# Patient Record
Sex: Male | Born: 1980 | Hispanic: No | Marital: Single | Smoking: Former smoker
Health system: Southern US, Community
[De-identification: ages and names within clinical notes are randomized; demographics above are authoritative.]

---

## 2006-05-24 ENCOUNTER — Encounter: Admission: RE | Admit: 2006-05-24 | Discharge: 2006-05-24 | Payer: Self-pay | Admitting: Specialist

## 2018-02-02 ENCOUNTER — Encounter: Payer: Self-pay | Admitting: Sports Medicine

## 2018-02-02 ENCOUNTER — Ambulatory Visit (INDEPENDENT_AMBULATORY_CARE_PROVIDER_SITE_OTHER): Payer: BLUE CROSS/BLUE SHIELD

## 2018-02-02 ENCOUNTER — Ambulatory Visit: Payer: BLUE CROSS/BLUE SHIELD | Admitting: Sports Medicine

## 2018-02-02 DIAGNOSIS — M25561 Pain in right knee: Secondary | ICD-10-CM | POA: Diagnosis not present

## 2018-02-02 DIAGNOSIS — M25569 Pain in unspecified knee: Secondary | ICD-10-CM

## 2018-02-02 DIAGNOSIS — M545 Low back pain, unspecified: Secondary | ICD-10-CM | POA: Insufficient documentation

## 2018-02-02 DIAGNOSIS — S83412A Sprain of medial collateral ligament of left knee, initial encounter: Secondary | ICD-10-CM

## 2018-02-02 DIAGNOSIS — M25562 Pain in left knee: Secondary | ICD-10-CM

## 2018-02-02 DIAGNOSIS — S8992XA Unspecified injury of left lower leg, initial encounter: Secondary | ICD-10-CM | POA: Diagnosis not present

## 2018-02-02 DIAGNOSIS — M48061 Spinal stenosis, lumbar region without neurogenic claudication: Secondary | ICD-10-CM | POA: Diagnosis not present

## 2018-02-02 MED ORDER — PREDNISONE 50 MG PO TABS: ORAL_TABLET | ORAL | 0 refills | Status: AC

## 2018-02-02 NOTE — Progress Notes (Signed)
Subjective:    CC: Back pain, left knee injury  HPI:  One month ago while playing soccer this pleasant 37 year old male went to kick the ball, he collided with another player, had immediate pain and swelling on the medial aspect of his left knee.  He was unable to continue playing.  His overall gotten to some degree worse.  Pain is medial no bruising, no swelling.  No mechanical symptoms.  Back pain: Left-sided, axial with radiation to the anterior left thigh, no bowel or bladder dysfunction, saddle numbness, no constitutional symptoms, no precipitating or palliating symptoms.  I reviewed the past medical history, family history, social history, surgical history, and allergies today and no changes were needed.  Please see the problem list section below in epic for further details.  Past Medical History: History reviewed. No pertinent past medical history. Past Surgical History: History reviewed. No pertinent surgical history. Social History: Social History   Socioeconomic History  . Marital status: Single    Spouse name: Belva ChimesVeronica Tamayo  . Number of children: 3  . Years of education: None  . Highest education level: None  Social Needs  . Financial resource strain: None  . Food insecurity - worry: None  . Food insecurity - inability: None  . Transportation needs - medical: None  . Transportation needs - non-medical: None  Occupational History  . None  Tobacco Use  . Smoking status: Former Games developermoker  . Smokeless tobacco: Never Used  Substance and Sexual Activity  . Alcohol use: Yes  . Drug use: No  . Sexual activity: None  Other Topics Concern  . None  Social History Narrative  . None   Family History: History reviewed. No pertinent family history. Allergies: No Known Allergies Medications: See med rec.  Review of Systems: No headache, visual changes, nausea, vomiting, diarrhea, constipation, dizziness, abdominal pain, skin rash, fevers, chills, night sweats, swollen  lymph nodes, weight loss, chest pain, body aches, joint swelling, muscle aches, shortness of breath, mood changes, visual or auditory hallucinations.  Objective:    General: Well Developed, well nourished, and in no acute distress.  Neuro: Alert and oriented x3, extra-ocular muscles intact, sensation grossly intact.  HEENT: Normocephalic, atraumatic, pupils equal round reactive to light, neck supple, no masses, no lymphadenopathy, thyroid nonpalpable.  Skin: Warm and dry, no rashes noted.  Cardiac: Regular rate and rhythm, no murmurs rubs or gallops.  Respiratory: Clear to auscultation bilaterally. Not using accessory muscles, speaking in full sentences.  Abdominal: Soft, nontender, nondistended, positive bowel sounds, no masses, no organomegaly.  Left Knee: Normal to inspection with no erythema or effusion or obvious bony abnormalities. Tender to palpation at the medial joint line, as well as at the tibial insertion of the MCL ROM normal in flexion and extension and lower leg rotation. Ligaments with solid consistent endpoints including ACL, PCL, LCL. There is some opening of the medial joint line with valgus stress consistent with a grade 2 MCL injury. Negative Mcmurray's and provocative meniscal tests. Non painful patellar compression. Patellar and quadriceps tendons unremarkable. Hamstring and quadriceps strength is normal. Back Exam:  Inspection: Unremarkable  Motion: Flexion 45 deg, Extension 45 deg, Side Bending to 45 deg bilaterally,  Rotation to 45 deg bilaterally  SLR laying: Negative  XSLR laying: Negative  Palpable tenderness: Tender to palpation over the left sacroiliac joint. FABER: negative. Sensory change: Gross sensation intact to all lumbar and sacral dermatomes.  Reflexes: 2+ at both patellar tendons, 2+ at achilles tendons, Babinski's downgoing.  Strength  at foot  Plantar-flexion: 5/5 Dorsi-flexion: 5/5 Eversion: 5/5 Inversion: 5/5  Leg strength  Quad: 5/5  Hamstring: 5/5 Hip flexor: 5/5 Hip abductors: 5/5  Gait unremarkable.  Impression and Recommendations:    The patient was counselled, risk factors were discussed, anticipatory guidance given.  Left-sided low back pain without sciatica X-rays, prednisone, home rehab exercises.  Tear of MCL (medial collateral ligament) of knee, left, initial encounter Grade 2, hinged knee brace. X-rays. Rehab exercises given, return in 1 month. ___________________________________________ Ihor Austin. Benjamin Stain, M.D., ABFM., CAQSM. Primary Care and Sports Medicine [facility]  Adjunct Instructor of Family Medicine  [employer]

## 2018-02-02 NOTE — Assessment & Plan Note (Signed)
X-rays, prednisone, home rehab exercises.

## 2018-02-02 NOTE — Assessment & Plan Note (Signed)
Grade 2, hinged knee brace. X-rays. Rehab exercises given, return in 1 month.

## 2018-03-07 ENCOUNTER — Ambulatory Visit: Payer: BLUE CROSS/BLUE SHIELD | Admitting: Sports Medicine

## 2018-03-07 ENCOUNTER — Encounter: Payer: Self-pay | Admitting: Sports Medicine

## 2018-03-07 DIAGNOSIS — S83412A Sprain of medial collateral ligament of left knee, initial encounter: Secondary | ICD-10-CM | POA: Diagnosis not present

## 2018-03-07 DIAGNOSIS — M545 Low back pain, unspecified: Secondary | ICD-10-CM

## 2018-03-07 NOTE — Progress Notes (Signed)
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  I reviewed the past medical history, family history, social history, surgical history, and allergies today and no changes were needed.  Please see the problem list section below in epic for further details.  Past Medical History: No past medical history on file. Past Surgical History: No past surgical history on file. Social History: Social History   Socioeconomic History  . Marital status: Single    Spouse name: Belva ChimesVeronica Loisel  . Number of children: 3  . Years of education: Not on file  . Highest education level: Not on file  Occupational History  . Not on file  Social Needs  . Financial resource strain: Not on file  . Food insecurity:    Worry: Not on file    Inability: Not on file  . Transportation needs:    Medical: Not on file    Non-medical: Not on file  Tobacco Use  . Smoking status: Former Games developermoker  . Smokeless tobacco: Never Used  Substance and Sexual Activity  . Alcohol use: Yes  . Drug use: No  . Sexual activity: Not on file  Lifestyle  . Physical activity:    Days per week: Not on file    Minutes per session: Not on file  . Stress: Not on file  Relationships  . Social connections:    Talks on phone: Not on file    Gets together: Not on file    Attends religious service: Not on file    Active member of club or organization: Not on file    Attends meetings of clubs or organizations: Not on file    Relationship status: Not on file  Other Topics Concern  . Not on file  Social History Narrative  . Not on file   Family History: No family history on file. Allergies: No Known Allergies Medications: See med rec.  Review of Systems: No fevers, chills, night sweats, weight loss,  chest pain, or shortness of breath.   Objective:    General: Well Developed, well nourished, and in no acute distress.  Neuro: Alert and oriented x3, extra-ocular muscles intact, sensation grossly intact.  HEENT: Normocephalic, atraumatic, pupils equal round reactive to light, neck supple, no masses, no lymphadenopathy, thyroid nonpalpable.  Skin: Warm and dry, no rashes. Cardiac: Regular rate and rhythm, no murmurs rubs or gallops, no lower extremity edema.  Respiratory: Clear to auscultation bilaterally. Not using accessory muscles, speaking in full sentences. Left knee: Normal to inspection with no erythema or effusion or obvious bony abnormalities. Palpation normal with no warmth or joint line tenderness or patellar tenderness or condyle tenderness. ROM normal in flexion and extension and lower leg rotation. Ligaments with solid consistent endpoints including ACL, PCL, LCL, MCL. He has just a touch of laxity to valgus stress on his left knee but when the right knee is examined it is similar. Negative Mcmurray's and provocative meniscal tests. Non painful patellar compression. Patellar and quadriceps tendons unremarkable. Hamstring and quadriceps strength is normal. Able to jump up and down the affected extremity without any discomfort.  Impression and Recommendations:    Tear of MCL (medial collateral ligament) of knee, left, initial encounter Resolved after 1 month of hinged knee brace, NSAIDs, rehab exercises. Exam is unremarkable  today, may return to soccer without restrictions.  Left-sided low back pain without sciatica L4-L5 degenerative disc disease, now resolved after prednisone, home rehabilitation exercises, I have advised him to continue the exercises indefinitely, return as needed. ___________________________________________ Ihor Austin. Benjamin Stain, M.D., ABFM., CAQSM. Primary Care and Sports Medicine  [facility]  Adjunct Instructor of Family  Medicine  [employer]

## 2018-03-07 NOTE — Assessment & Plan Note (Signed)
L4-L5 degenerative disc disease, now resolved after prednisone, home rehabilitation exercises, I have advised him to continue the exercises indefinitely, return as needed.

## 2018-03-07 NOTE — Assessment & Plan Note (Signed)
Resolved after 1 month of hinged knee brace, NSAIDs, rehab exercises. Exam is unremarkable today, may return to soccer without restrictions.

## 2018-06-12 DIAGNOSIS — R21 Rash and other nonspecific skin eruption: Secondary | ICD-10-CM | POA: Diagnosis not present

## 2018-08-21 DIAGNOSIS — R05 Cough: Secondary | ICD-10-CM | POA: Diagnosis not present

## 2018-08-21 DIAGNOSIS — J309 Allergic rhinitis, unspecified: Secondary | ICD-10-CM | POA: Diagnosis not present

## 2019-02-27 IMAGING — DX DG LUMBAR SPINE COMPLETE 4+V
5 series · 5 of 5 positions shown · non-contrast
Comparison: None.

CLINICAL DATA: Low back pain for 2 months.  Injury playing soccer.

EXAM:
LUMBAR SPINE - COMPLETE 4+ VIEW

[l-spine ap]
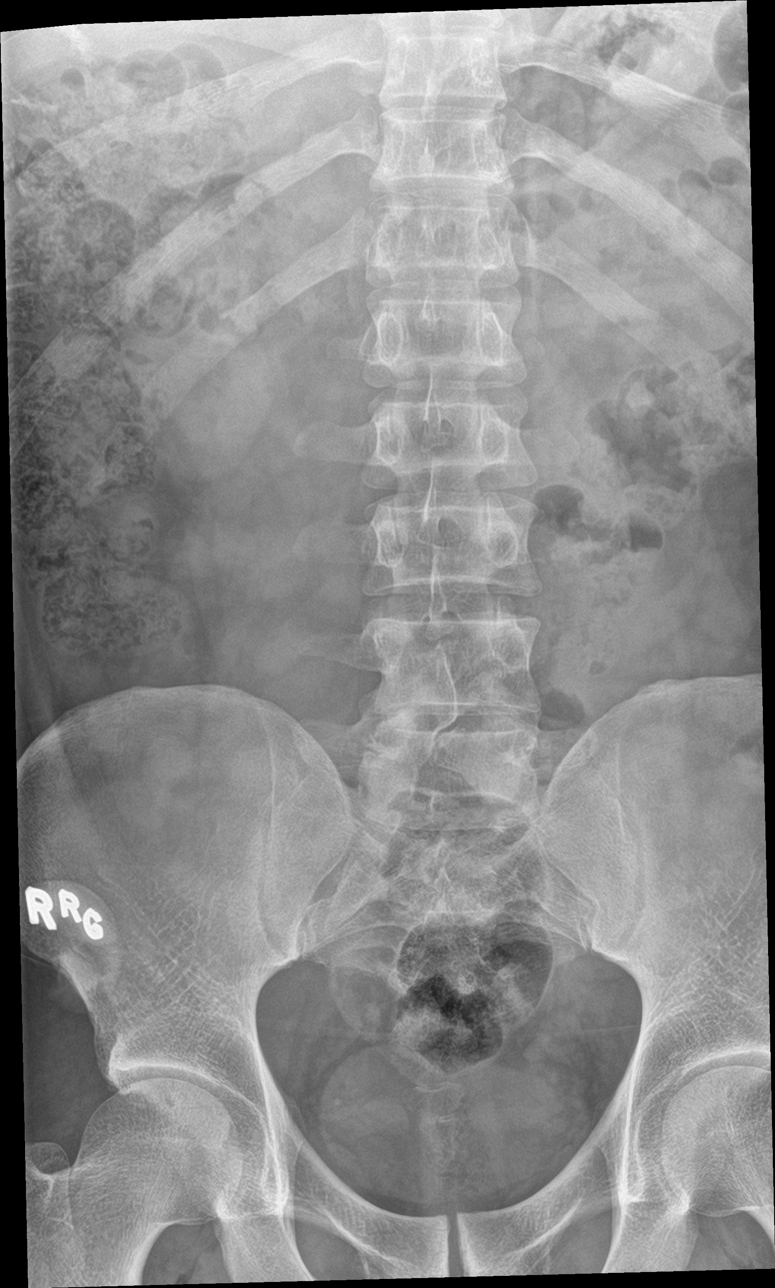

[l-spine obl (1 of 2)]
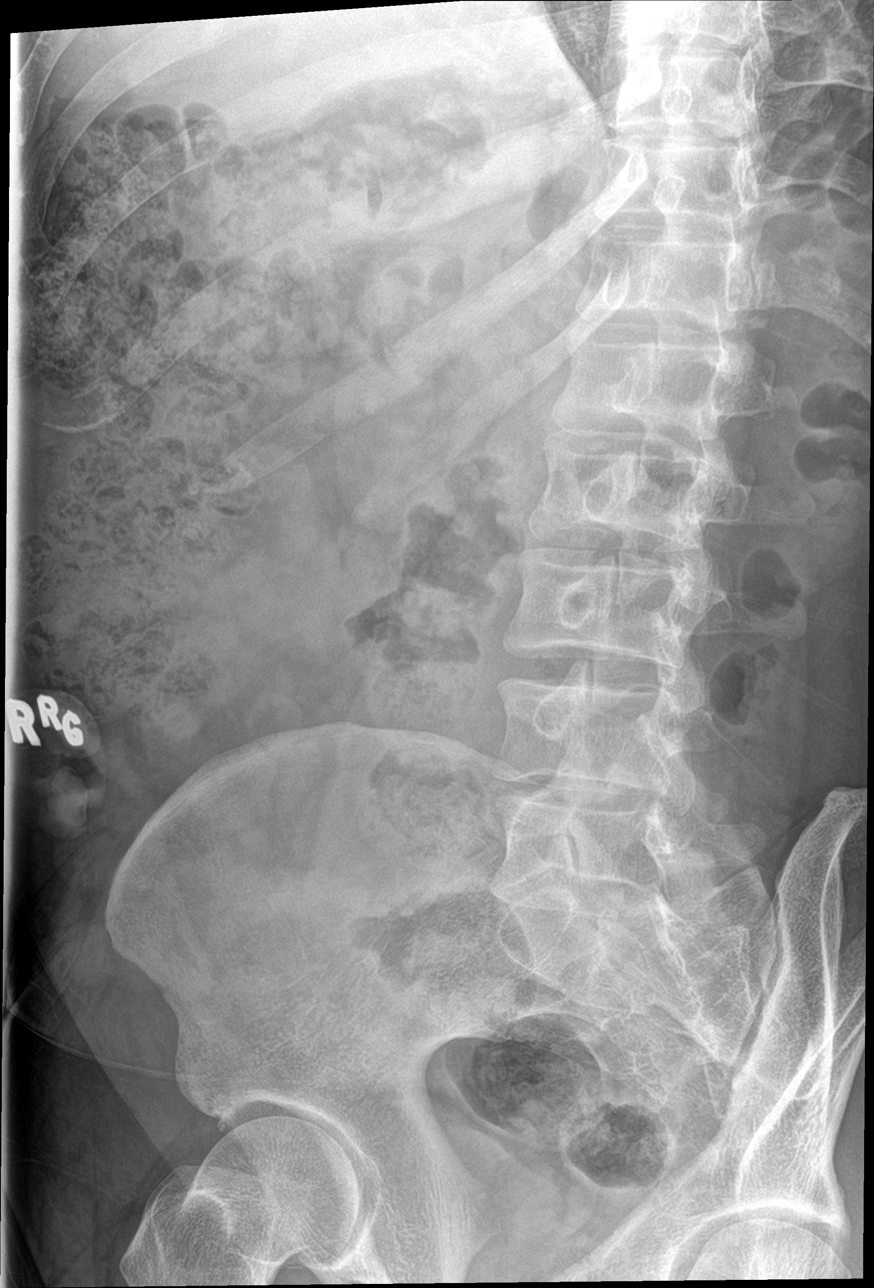

[l-spine obl (2 of 2)]
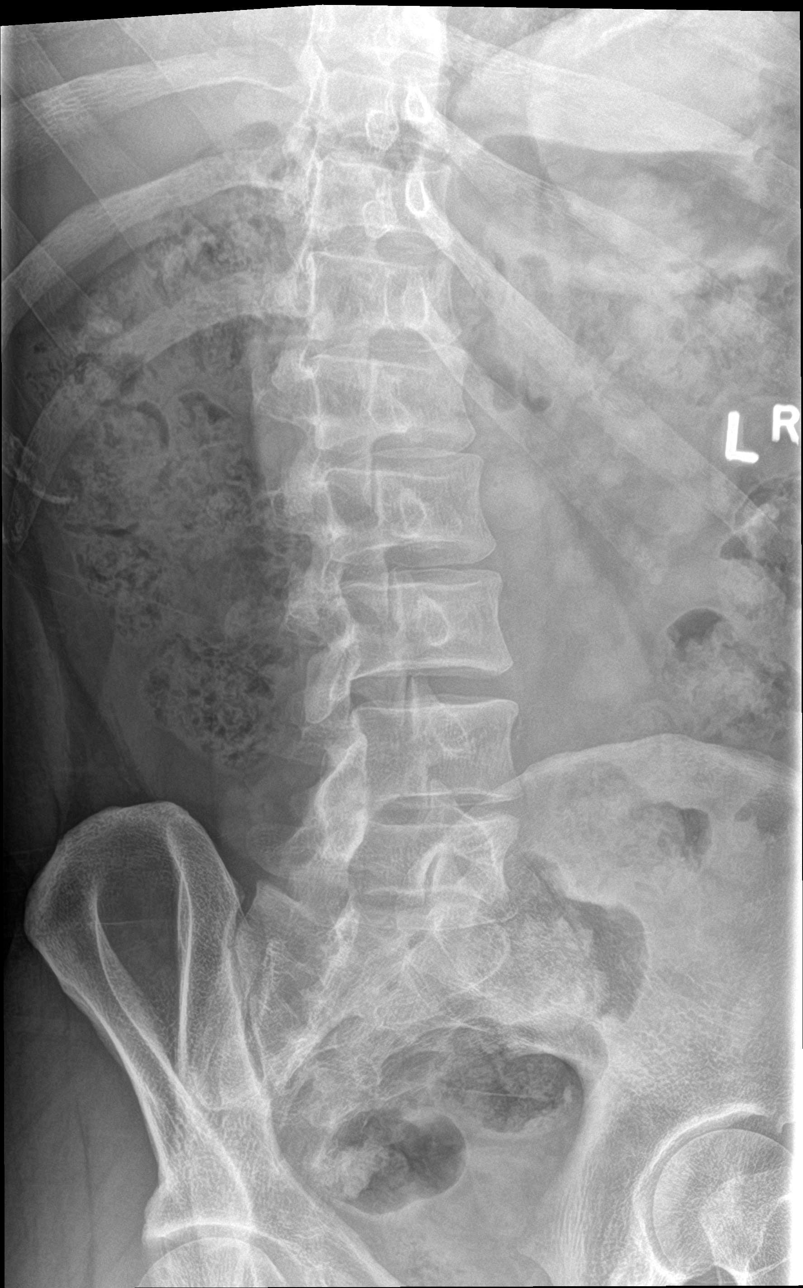

[l-spine lat]
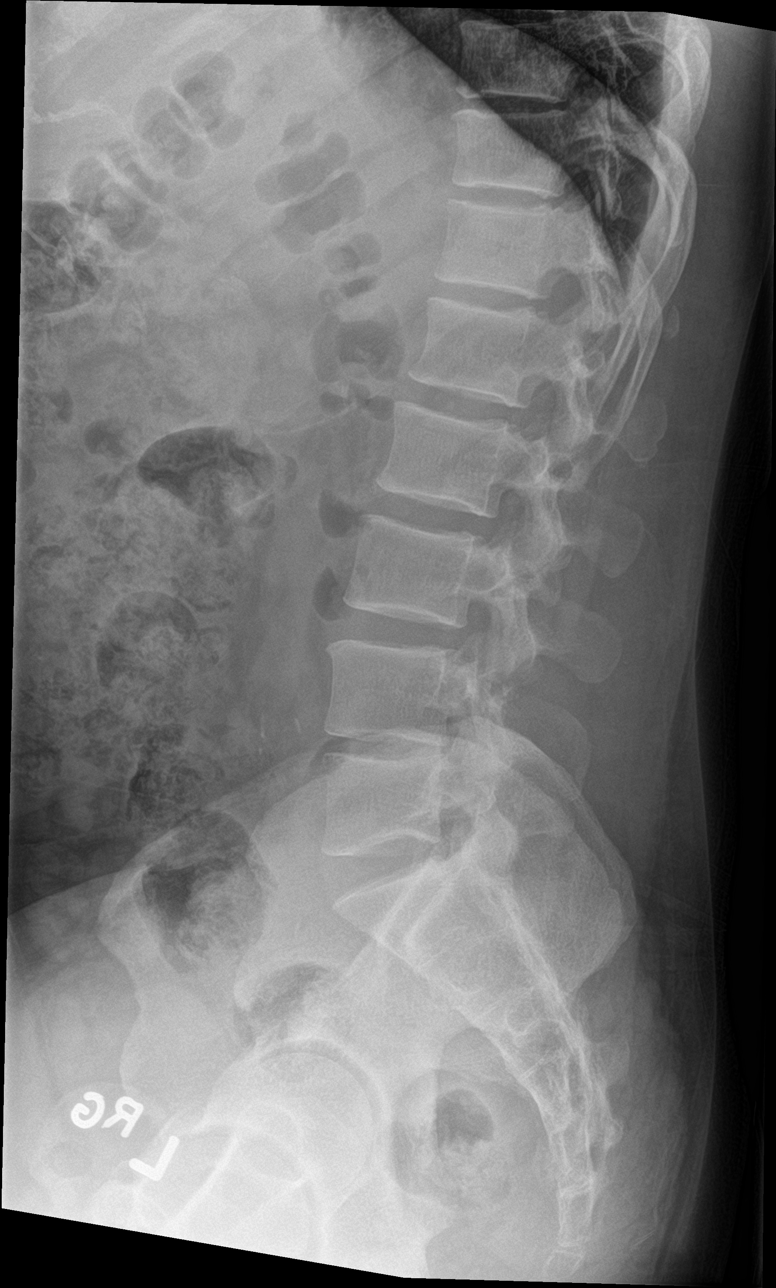

[l-spine spot]
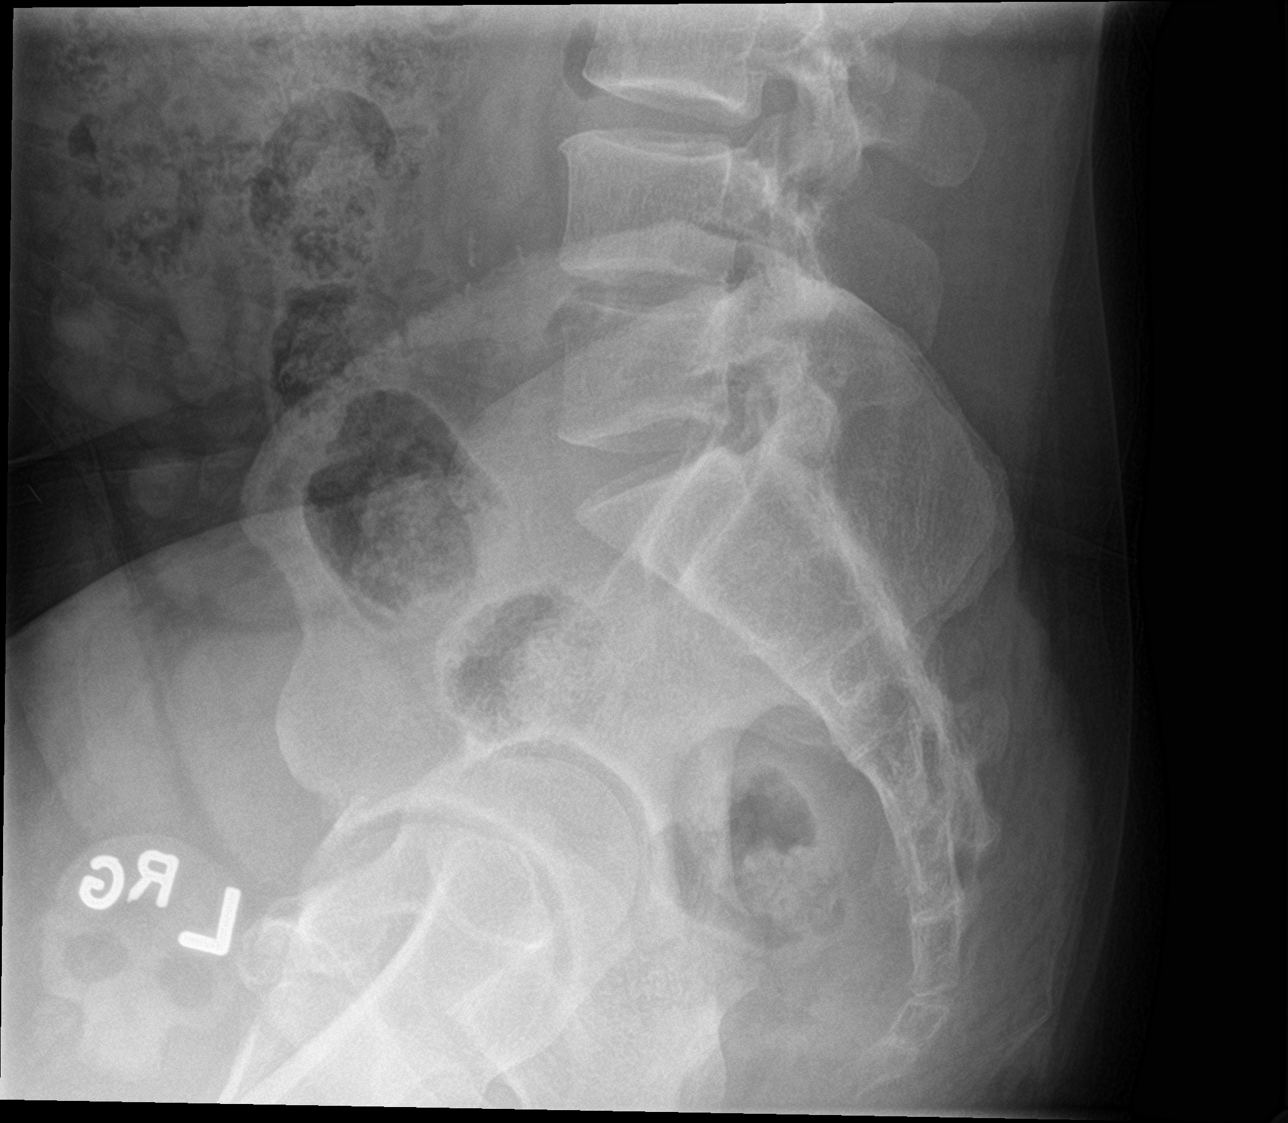

[5 of 5 positions shown; findings below may reference images not displayed]

FINDINGS: There is no evidence of lumbar spine fracture. Alignment is normal.
Mild disc space narrowing seen at L4-5. No facet DJD or other
osseous abnormality identified.
IMPRESSION: No acute findings.  Mild degenerative disc space narrowing at L4-5.
